# Patient Record
Sex: Female | Born: 1970 | Race: White | Hispanic: No | Smoking: Never smoker
Health system: Southern US, Community
[De-identification: ages and names within clinical notes are randomized; demographics above are authoritative.]

## PROBLEM LIST (undated history)

## (undated) DIAGNOSIS — K4090 Unilateral inguinal hernia, without obstruction or gangrene, not specified as recurrent: Secondary | ICD-10-CM

## (undated) DIAGNOSIS — N200 Calculus of kidney: Secondary | ICD-10-CM

## (undated) DIAGNOSIS — K5790 Diverticulosis of intestine, part unspecified, without perforation or abscess without bleeding: Secondary | ICD-10-CM

---

## 2014-09-01 ENCOUNTER — Encounter (HOSPITAL_BASED_OUTPATIENT_CLINIC_OR_DEPARTMENT_OTHER): Payer: Self-pay

## 2014-09-01 ENCOUNTER — Emergency Department (HOSPITAL_BASED_OUTPATIENT_CLINIC_OR_DEPARTMENT_OTHER)
Admission: EM | Admit: 2014-09-01 | Discharge: 2014-09-01 | Disposition: A | Discharge status: Home / Self Care | Payer: PRIVATE HEALTH INSURANCE | Attending: Emergency Medicine | Admitting: Emergency Medicine

## 2014-09-01 ENCOUNTER — Emergency Department (HOSPITAL_BASED_OUTPATIENT_CLINIC_OR_DEPARTMENT_OTHER): Payer: PRIVATE HEALTH INSURANCE

## 2014-09-01 DIAGNOSIS — B349 Viral infection, unspecified: Secondary | ICD-10-CM | POA: Diagnosis not present

## 2014-09-01 DIAGNOSIS — R509 Fever, unspecified: Secondary | ICD-10-CM | POA: Diagnosis present

## 2014-09-01 DIAGNOSIS — R000 Tachycardia, unspecified: Secondary | ICD-10-CM | POA: Insufficient documentation

## 2014-09-01 DIAGNOSIS — R6889 Other general symptoms and signs: Secondary | ICD-10-CM

## 2014-09-01 DIAGNOSIS — R05 Cough: Secondary | ICD-10-CM

## 2014-09-01 DIAGNOSIS — R059 Cough, unspecified: Secondary | ICD-10-CM

## 2014-09-01 DIAGNOSIS — Z880 Allergy status to penicillin: Secondary | ICD-10-CM | POA: Diagnosis not present

## 2014-09-01 LAB — RAPID STREP SCREEN (MED CTR MEBANE ONLY): Streptococcus, Group A Screen (Direct): NEGATIVE

## 2014-09-01 MED ORDER — IBUPROFEN 800 MG PO TABS
ORAL_TABLET | ORAL | Status: AC
  Filled 2014-09-01: qty 1

## 2014-09-01 MED ORDER — IBUPROFEN 800 MG PO TABS
800.0000 mg | ORAL_TABLET | Freq: Once | ORAL | Status: AC
  Administered 2014-09-01: 800 mg via ORAL

## 2014-09-01 MED ORDER — OSELTAMIVIR PHOSPHATE 75 MG PO CAPS: 75.0000 mg | ORAL_CAPSULE | Freq: Two times a day (BID) | ORAL | Status: AC

## 2014-09-01 NOTE — Discharge Instructions (Signed)
Rest and stay well hydrated. Take tamiflu as prescribed. Take tylenol and ibuprofen for your fever.  Influenza Influenza ("the flu") is a viral infection of the respiratory tract. It occurs more often in winter months because people spend more time in close contact with one another. Influenza can make you feel very sick. Influenza easily spreads from person to person (contagious). CAUSES  Influenza is caused by a virus that infects the respiratory tract. You can catch the virus by breathing in droplets from an infected person's cough or sneeze. You can also catch the virus by touching something that was recently contaminated with the virus and then touching your mouth, nose, or eyes. RISKS AND COMPLICATIONS You may be at risk for a more severe case of influenza if you smoke cigarettes, have diabetes, have chronic heart disease (such as heart failure) or lung disease (such as asthma), or if you have a weakened immune system. Elderly people and pregnant women are also at risk for more serious infections. The most common problem of influenza is a lung infection (pneumonia). Sometimes, this problem can require emergency medical care and may be life threatening. SIGNS AND SYMPTOMS  Symptoms typically last 4 to 10 days and may include:  Fever.  Chills.  Headache, body aches, and muscle aches.  Sore throat.  Chest discomfort and cough.  Poor appetite.  Weakness or feeling tired.  Dizziness.  Nausea or vomiting. DIAGNOSIS  Diagnosis of influenza is often made based on your history and a physical exam. A nose or throat swab test can be done to confirm the diagnosis. TREATMENT  In mild cases, influenza goes away on its own. Treatment is directed at relieving symptoms. For more severe cases, your health care provider may prescribe antiviral medicines to shorten the sickness. Antibiotic medicines are not effective because the infection is caused by a virus, not by bacteria. HOME CARE  INSTRUCTIONS  Take medicines only as directed by your health care provider.  Use a cool mist humidifier to make breathing easier.  Get plenty of rest until your temperature returns to normal. This usually takes 3 to 4 days.  Drink enough fluid to keep your urine clear or pale yellow.  Cover yourmouth and nosewhen coughing or sneezing,and wash your handswellto prevent thevirusfrom spreading.  Stay homefromwork orschool untilthe fever is gonefor at least 80fll day. PREVENTION  An annual influenza vaccination (flu shot) is the best way to avoid getting influenza. An annual flu shot is now routinely recommended for all adults in the [place]:  You experiencechest pain, yourcough worsens,or you producemore mucus.  Youhave nausea,vomiting, ordiarrhea.  Your fever returns or gets worse. SEEK IMMEDIATE MEDICAL CARE IF:  You havetrouble breathing, you become short of breath,or your skin ornails becomebluish.  You have severe painor stiffnessin the neck.  You develop a sudden headache, or pain in the face or ear.  You have nausea or vomiting that you cannot control. MAKE SURE YOU:   Understand these instructions.  Will watch your condition.  Will get help right away if you are not doing well or get worse. Document Released: 08/23/2000 Document Revised: 01/10/2014 Document Reviewed: 11/25/2011 [facility] Information 2015 [place] This information is not intended to replace advice given to you by your health care provider. Make sure you discuss any questions you have with your health care provider.  Viral Infections A viral infection can be caused by different types of viruses.Most viral infections are not serious and resolve on their own.  However, some infections may cause severe symptoms and may lead to further complications. SYMPTOMS Viruses can frequently cause:  Minor sore throat.  Aches and  pains.  Headaches.  Runny nose.  Different types of rashes.  Watery eyes.  Tiredness.  Cough.  Loss of appetite.  Gastrointestinal infections, resulting in nausea, vomiting, and diarrhea. These symptoms do not respond to antibiotics because the infection is not caused by bacteria. However, you might catch a bacterial infection following the viral infection. This is sometimes called a "superinfection." Symptoms of such a bacterial infection may include:  Worsening sore throat with pus and difficulty swallowing.  Swollen neck glands.  Chills and a high or persistent fever.  Severe headache.  Tenderness over the sinuses.  Persistent overall ill feeling (malaise), muscle aches, and tiredness (fatigue).  Persistent cough.  Yellow, green, or brown mucus production with coughing. HOME CARE INSTRUCTIONS   Only take over-the-counter or prescription medicines for pain, discomfort, diarrhea, or fever as directed by your caregiver.  Drink enough water and fluids to keep your urine clear or pale yellow. Sports drinks can provide valuable electrolytes, sugars, and hydration.  Get plenty of rest and maintain proper nutrition. Soups and broths with crackers or rice are fine. SEEK IMMEDIATE MEDICAL CARE IF:   You have severe headaches, shortness of breath, chest pain, neck pain, or an unusual rash.  You have uncontrolled vomiting, diarrhea, or you are unable to keep down fluids.  You or your child has an oral temperature above 102 F (38.9 C), not controlled by medicine.  Your baby is older than 3 months with a rectal temperature of 102 F (38.9 C) or higher.  Your baby is 15 months old or younger with a rectal temperature of 100.4 F (38 C) or higher. MAKE SURE YOU:   Understand these instructions.  Will watch your condition.  Will get help right away if you are not doing well or get worse. Document Released: 06/05/2005 Document Revised: 11/18/2011 Document Reviewed:  12/31/2010 [facility] Patient Information 2015 [place], [place]. This information is not intended to replace advice given to you by your health care provider. Make sure you discuss any questions you have with your health care provider.

## 2014-09-01 NOTE — ED Provider Notes (Signed)
CSN: [id number]     Arrival date & time 09/01/14  1624 History   First MD Initiated Contact with Patient 09/01/14 1756     Chief Complaint  Patient presents with  . Fever     (Consider location/radiation/quality/duration/timing/severity/associated sxs/prior Treatment) HPI Comments: 43 year old female presenting with fever beginning around 12:00 PM today. Patient reports yesterday she was not feeling very well, and today developed a productive cough with clear mucus, chest congestion, generalized body aches and nausea. She did not check her temperature at home. She took Tylenol around 3:00 PM today. No vomiting or diarrhea. Denies any urinary symptoms.  The history is provided by the patient.    History reviewed. No pertinent past medical history. History reviewed. No pertinent past surgical history. No family history on file. History  Substance Use Topics  . Smoking status: Never Smoker   . Smokeless tobacco: Not on file  . Alcohol Use: No   OB History    No data available     Review of Systems  10 Systems reviewed and are negative for acute change except as noted in the HPI.  Allergies  Penicillins; Sulfa antibiotics; and Ceftin  Home Medications   Prior to Admission medications   Medication Sig Start Date End Date Taking? Authorizing Provider  Sertraline HCl (ZOLOFT PO) Take by mouth.   Yes Historical Provider, MD  oseltamivir (TAMIFLU) 75 MG capsule Take 1 capsule (75 mg total) by mouth every 12 (twelve) hours. 09/01/14   Daleigh Pollinger M Adilenne Ashworth, PA-C   BP 151/83 mmHg  Pulse 102  Temp(Src) 100.8 F (38.2 C) (Oral)  Resp 16  Ht 5' 6"  (1.676 m)  Wt 195 lb (88.451 kg)  BMI 31.49 kg/m2  SpO2 97%  LMP 07/24/2014 Physical Exam  Constitutional: She is oriented to person, place, and time. She appears well-developed and well-nourished. No distress.  HENT:  Head: Normocephalic and atraumatic.  Post-oropharyngeal erythema without edema or exudate.  Eyes: Conjunctivae are  normal.  Neck: Normal range of motion. Neck supple.  No meningeal signs.  Cardiovascular: Regular rhythm and normal heart sounds.   Tachycardic.  Pulmonary/Chest: Effort normal and breath sounds normal. No respiratory distress.  Abdominal: Soft. Bowel sounds are normal. There is no tenderness.  Musculoskeletal: Normal range of motion. She exhibits no edema.  Neurological: She is alert and oriented to person, place, and time.  Skin: Skin is warm and dry. No rash noted. She is not diaphoretic.  Psychiatric: She has a normal mood and affect. Her behavior is normal.  Nursing note and vitals reviewed.   ED Course  Procedures (including critical care time) Labs Review Labs Reviewed  RAPID STREP SCREEN  CULTURE, GROUP A STREP    Imaging Review Dg Chest 2 View  09/01/2014   CLINICAL DATA:  Cough starting yesterday with chest tightness. Fever today. History of asthma.  EXAM: CHEST  2 VIEW  COMPARISON:  None.  FINDINGS: The heart size and mediastinal contours are within normal limits. Both lungs are clear. The visualized skeletal structures are unremarkable.  IMPRESSION: No active cardiopulmonary disease.   Electronically Signed   By: Marin Olp M.D.   On: 09/01/2014 17:00     EKG Interpretation None      MDM   Final diagnoses:  Viral illness  Flu-like symptoms   Patient in no apparent distress. Tachycardic on arrival with a fever. Chest x-ray obtained in triage prior to patient being seen, no acute finding. Rapid strep negative. Her symptoms began today and her  flulike. Will start Tamiflu. After receiving ibuprofen, her temperature started to decrease in her pulse rate started to improve. She states she is feeling a little better. Discussed symptomatic treatment. Stable for discharge. Return precautions given. Patient states understanding of treatment care plan and is agreeable.   Carman Ching, PA-C 09/01/14 1918  Threasa Beards, MD 09/01/14 [place]

## 2014-09-01 NOTE — ED Notes (Signed)
C/o prod cough, chest congestion, HA started yesterday-fever today

## 2014-09-03 LAB — CULTURE, GROUP A STREP

## 2015-10-13 IMAGING — CR DG CHEST 2V
2 series · 2 of 2 positions shown · non-contrast
Comparison: None.

CLINICAL DATA: Cough starting yesterday with chest tightness. Fever
today. History of asthma.

EXAM:
CHEST  2 VIEW

[w chest pa]
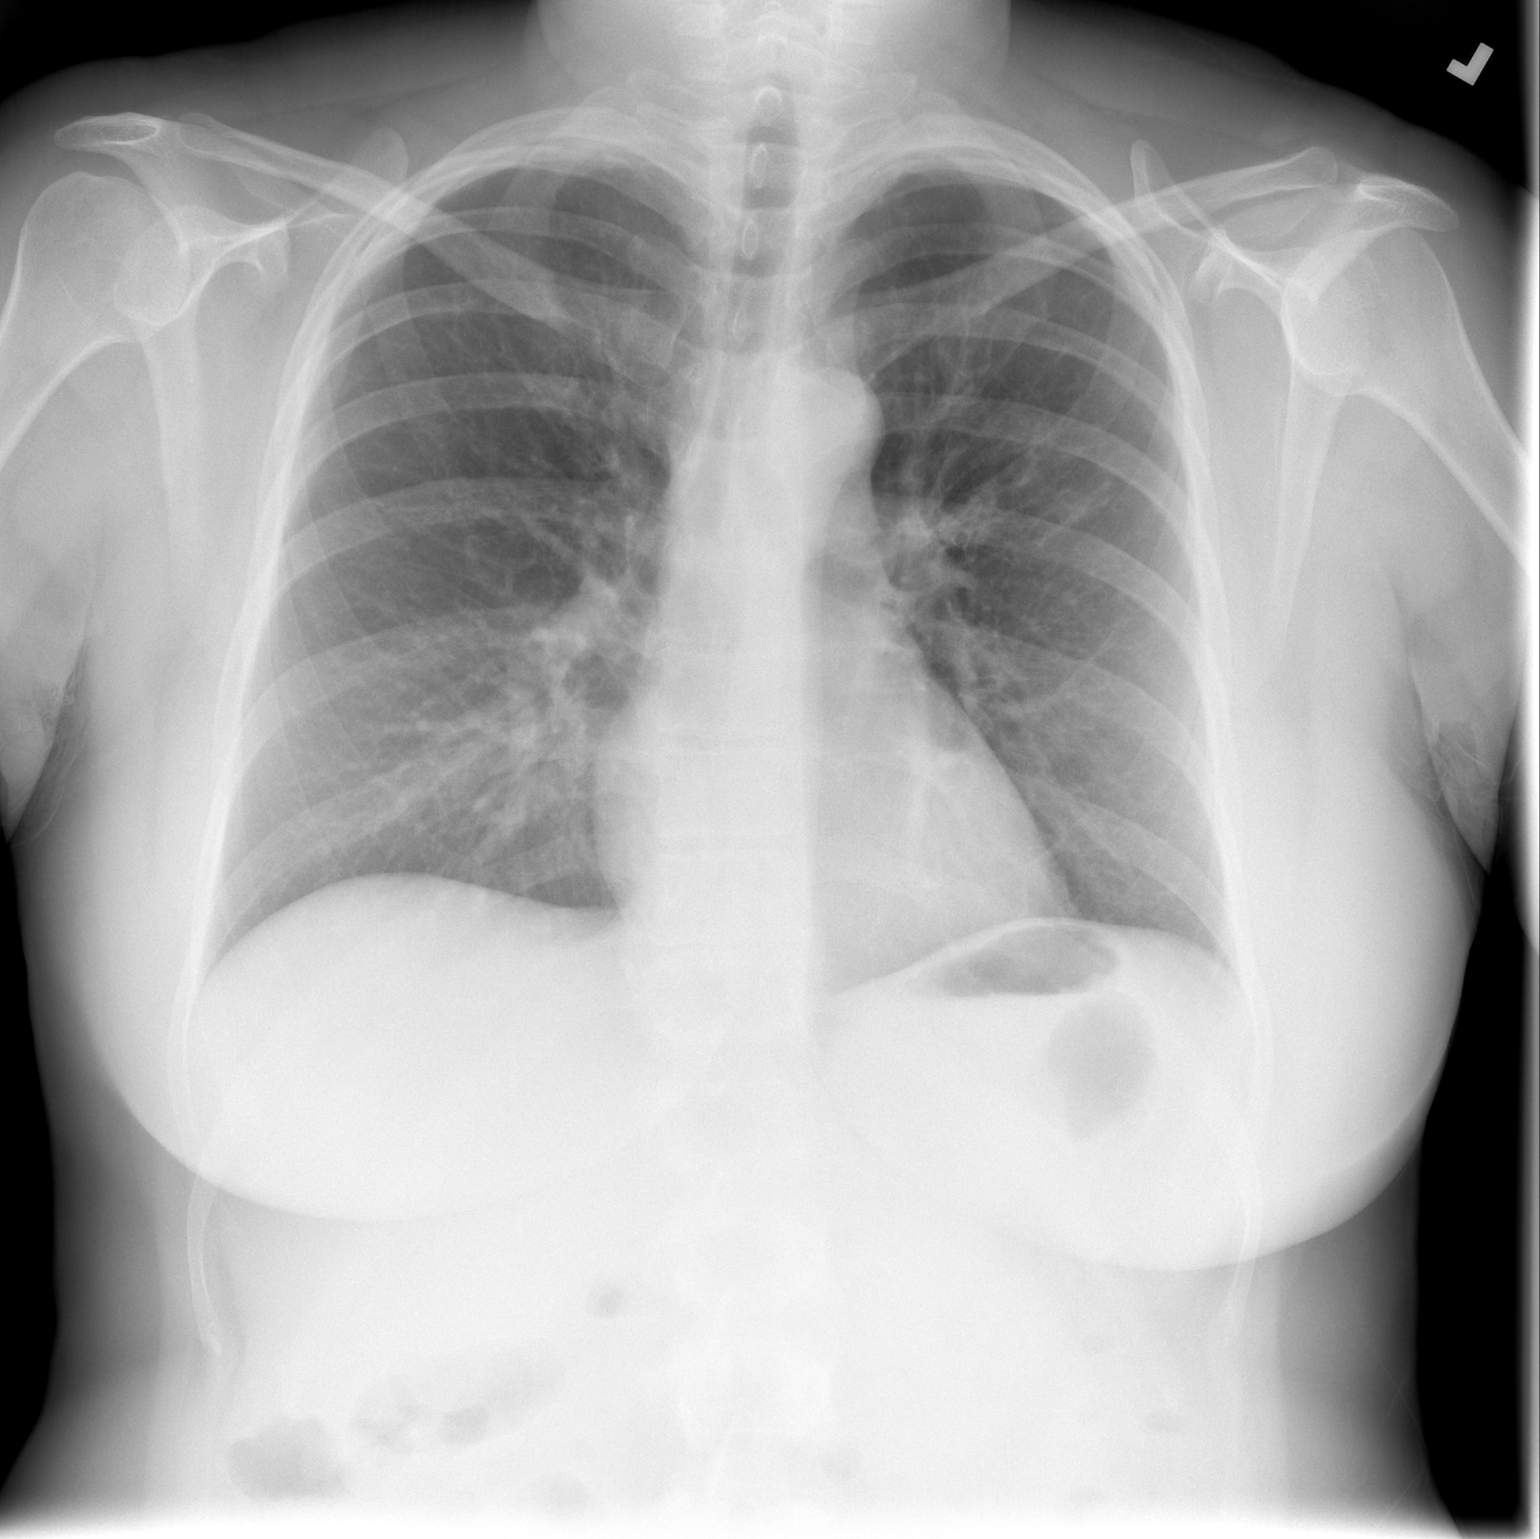

[w chest lat]
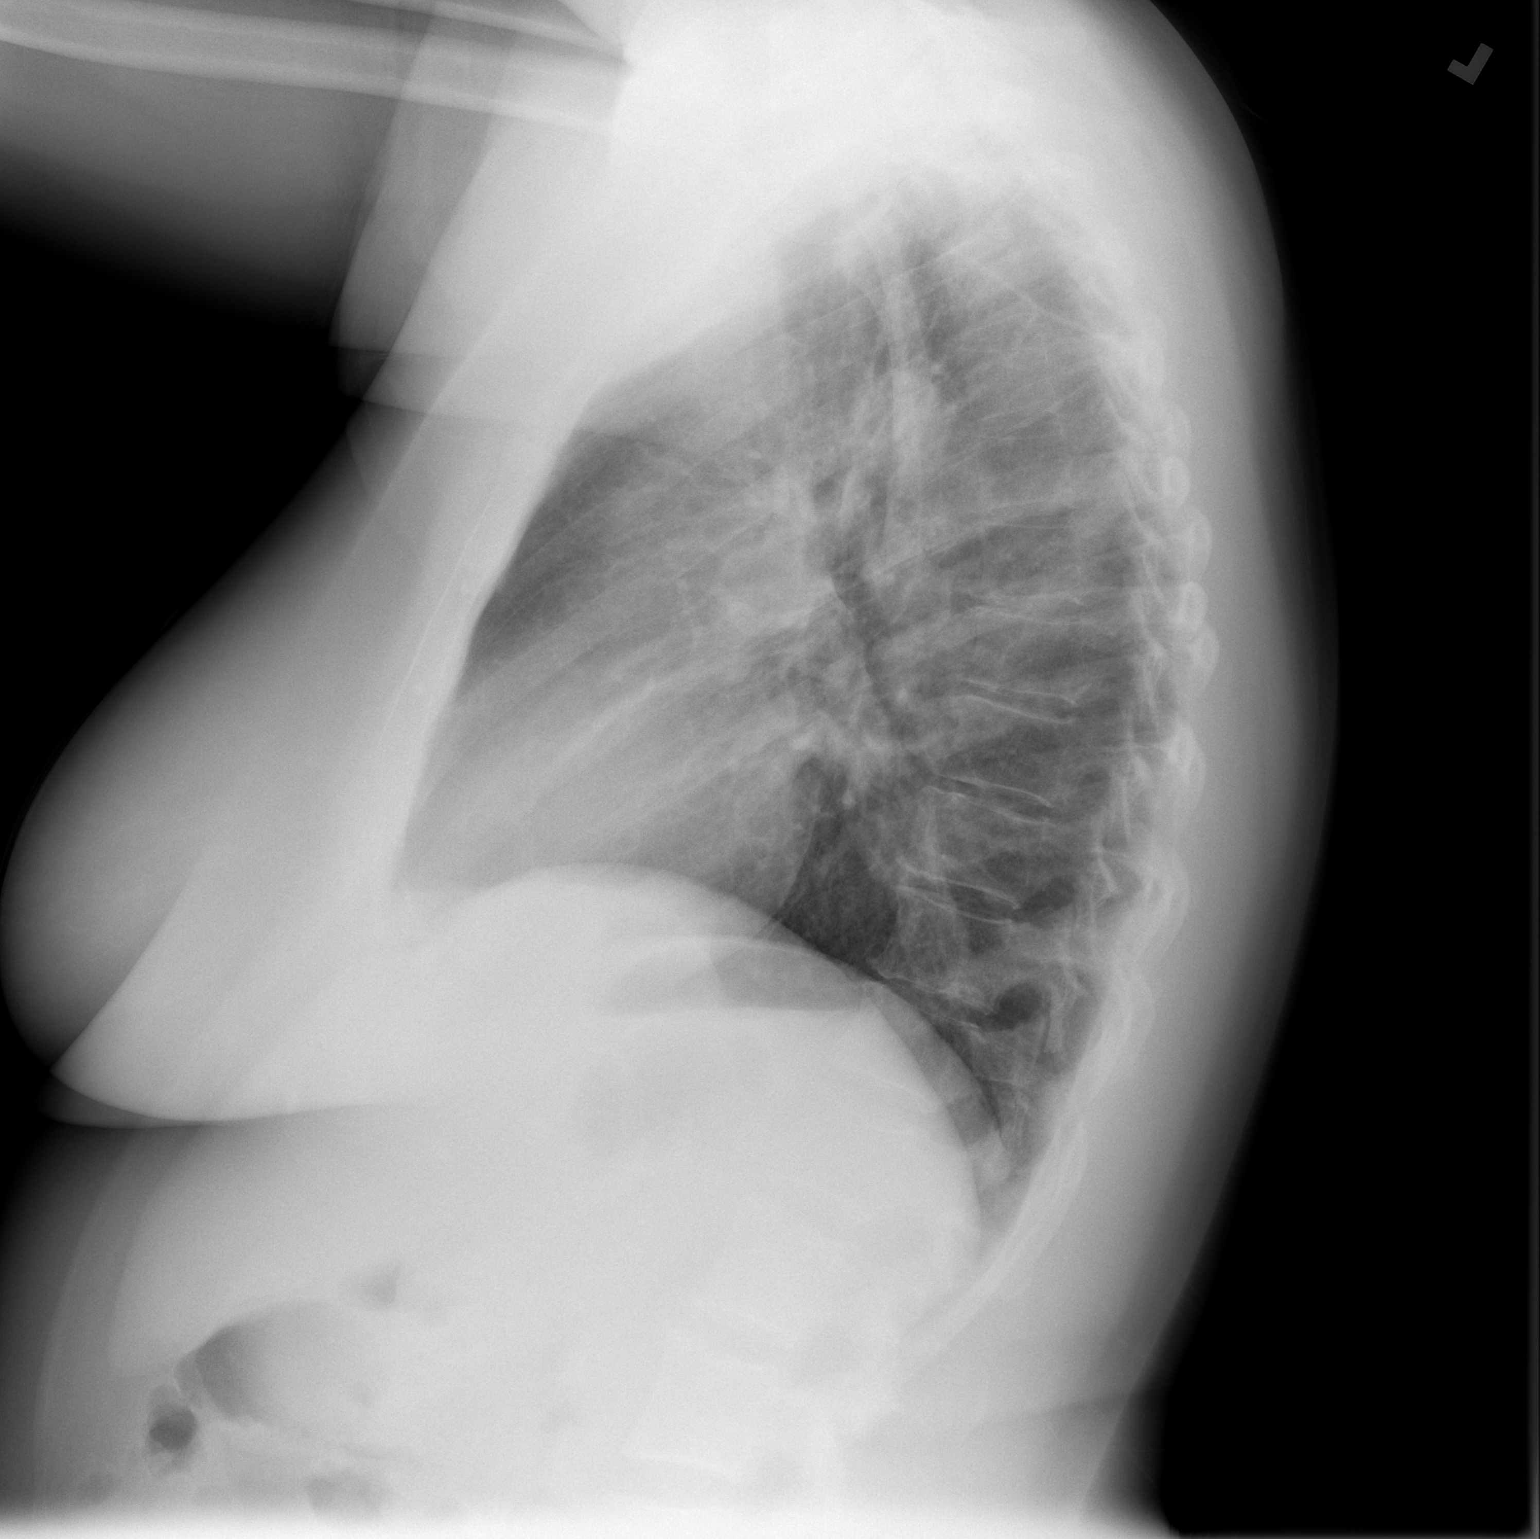

[2 of 2 positions shown; findings below may reference images not displayed]

FINDINGS: The heart size and mediastinal contours are within normal limits.
Both lungs are clear. The visualized skeletal structures are
unremarkable.
IMPRESSION: No active cardiopulmonary disease.

## 2023-03-07 ENCOUNTER — Other Ambulatory Visit: Payer: Self-pay

## 2023-03-07 DIAGNOSIS — N132 Hydronephrosis with renal and ureteral calculous obstruction: Secondary | ICD-10-CM | POA: Insufficient documentation

## 2023-03-07 DIAGNOSIS — K5732 Diverticulitis of large intestine without perforation or abscess without bleeding: Secondary | ICD-10-CM | POA: Diagnosis not present

## 2023-03-07 DIAGNOSIS — R1031 Right lower quadrant pain: Secondary | ICD-10-CM | POA: Diagnosis present

## 2023-03-07 NOTE — ED Triage Notes (Signed)
Pt reports chronic abdominal pain/kidney stones for past year. Pain became significantly worse last night and patient developed fever of 101F. Pt reports feeling like she isnt fully emptying when urinating. PT states she took an oxycodone at night which did not improve the pain. Admits to nausea, denies vomiting. Pain worst in lower abdomen. Movement exacerbates pain.

## 2023-03-08 ENCOUNTER — Emergency Department (HOSPITAL_BASED_OUTPATIENT_CLINIC_OR_DEPARTMENT_OTHER): Payer: 59

## 2023-03-08 ENCOUNTER — Emergency Department (HOSPITAL_BASED_OUTPATIENT_CLINIC_OR_DEPARTMENT_OTHER)
Admission: EM | Admit: 2023-03-08 | Discharge: 2023-03-08 | Disposition: A | Discharge status: Home / Self Care | Payer: 59 | Attending: Emergency Medicine | Admitting: Emergency Medicine

## 2023-03-08 ENCOUNTER — Encounter (HOSPITAL_BASED_OUTPATIENT_CLINIC_OR_DEPARTMENT_OTHER): Payer: Self-pay

## 2023-03-08 DIAGNOSIS — N200 Calculus of kidney: Secondary | ICD-10-CM

## 2023-03-08 DIAGNOSIS — K5732 Diverticulitis of large intestine without perforation or abscess without bleeding: Secondary | ICD-10-CM

## 2023-03-08 HISTORY — DX: Unilateral inguinal hernia, without obstruction or gangrene, not specified as recurrent: K40.90

## 2023-03-08 HISTORY — DX: Diverticulosis of intestine, part unspecified, without perforation or abscess without bleeding: K57.90

## 2023-03-08 HISTORY — DX: Calculus of kidney: N20.0

## 2023-03-08 LAB — URINALYSIS, ROUTINE W REFLEX MICROSCOPIC
Bilirubin Urine: NEGATIVE
Glucose, UA: NEGATIVE mg/dL
Ketones, ur: NEGATIVE mg/dL
Nitrite: NEGATIVE
Protein, ur: 30 mg/dL — AB
Specific Gravity, Urine: 1.02 (ref 1.005–1.030)
pH: 6 (ref 5.0–8.0)

## 2023-03-08 LAB — CBC
HCT: 36.6 % (ref 36.0–46.0)
Hemoglobin: 12.2 g/dL (ref 12.0–15.0)
MCH: 30.4 pg (ref 26.0–34.0)
MCHC: 33.3 g/dL (ref 30.0–36.0)
MCV: 91.3 fL (ref 80.0–100.0)
Platelets: 260 10*3/uL (ref 150–400)
RBC: 4.01 MIL/uL (ref 3.87–5.11)
RDW: 13 % (ref 11.5–15.5)
WBC: 17.9 10*3/uL — ABNORMAL HIGH (ref 4.0–10.5)
nRBC: 0 % (ref 0.0–0.2)

## 2023-03-08 LAB — COMPREHENSIVE METABOLIC PANEL
ALT: 20 U/L (ref 0–44)
AST: 23 U/L (ref 15–41)
Albumin: 4.2 g/dL (ref 3.5–5.0)
Alkaline Phosphatase: 108 U/L (ref 38–126)
Anion gap: 9 (ref 5–15)
BUN: 12 mg/dL (ref 6–20)
CO2: 23 mmol/L (ref 22–32)
Calcium: 9 mg/dL (ref 8.9–10.3)
Chloride: 104 mmol/L (ref 98–111)
Creatinine, Ser: 0.93 mg/dL (ref 0.44–1.00)
GFR, Estimated: >60 mL/min (ref >60)
Glucose, Bld: 142 mg/dL — ABNORMAL HIGH (ref 70–99)
Potassium: 3.6 mmol/L (ref 3.5–5.1)
Sodium: 136 mmol/L (ref 135–145)
Total Bilirubin: 0.7 mg/dL (ref 0.3–1.2)
Total Protein: 7.7 g/dL (ref 6.5–8.1)

## 2023-03-08 LAB — URINALYSIS, MICROSCOPIC (REFLEX)

## 2023-03-08 LAB — HCG, QUANTITATIVE, PREGNANCY: hCG, Beta Chain, Quant, S: 2 m[IU]/mL (ref <5)

## 2023-03-08 LAB — LIPASE, BLOOD: Lipase: 43 U/L (ref 11–51)

## 2023-03-08 LAB — TROPONIN I (HIGH SENSITIVITY): Troponin I (High Sensitivity): 2 ng/L (ref <18)

## 2023-03-08 MED ORDER — METRONIDAZOLE 500 MG PO TABS: 500.0000 mg | ORAL_TABLET | Freq: Three times a day (TID) | ORAL | 0 refills | Status: AC

## 2023-03-08 MED ORDER — NITROFURANTOIN MONOHYD MACRO 100 MG PO CAPS
100.0000 mg | ORAL_CAPSULE | Freq: Once | ORAL | Status: AC
  Administered 2023-03-08: 100 mg via ORAL
  Filled 2023-03-08: qty 1

## 2023-03-08 MED ORDER — METRONIDAZOLE 500 MG PO TABS: 500.0000 mg | ORAL_TABLET | Freq: Three times a day (TID) | ORAL | 0 refills | Status: DC

## 2023-03-08 MED ORDER — ONDANSETRON 4 MG PO TBDP: 4.0000 mg | ORAL_TABLET | Freq: Once | ORAL | Status: DC | PRN

## 2023-03-08 MED ORDER — NITROFURANTOIN MONOHYD MACRO 100 MG PO CAPS: 100.0000 mg | ORAL_CAPSULE | Freq: Two times a day (BID) | ORAL | 0 refills | Status: DC

## 2023-03-08 MED ORDER — ONDANSETRON 8 MG PO TBDP: ORAL_TABLET | ORAL | 0 refills | Status: DC

## 2023-03-08 MED ORDER — TAMSULOSIN HCL 0.4 MG PO CAPS
0.4000 mg | ORAL_CAPSULE | ORAL | Status: AC
  Administered 2023-03-08: 0.4 mg via ORAL
  Filled 2023-03-08: qty 1

## 2023-03-08 MED ORDER — TAMSULOSIN HCL 0.4 MG PO CAPS: 0.4000 mg | ORAL_CAPSULE | Freq: Every day | ORAL | 0 refills | Status: AC

## 2023-03-08 MED ORDER — DICLOFENAC SODIUM ER 100 MG PO TB24: 100.0000 mg | ORAL_TABLET | Freq: Every day | ORAL | 0 refills | Status: AC

## 2023-03-08 MED ORDER — HYDROCODONE-ACETAMINOPHEN 5-325 MG PO TABS: 1.0000 | ORAL_TABLET | Freq: Four times a day (QID) | ORAL | 0 refills | Status: AC | PRN

## 2023-03-08 MED ORDER — MAGNESIUM SULFATE 2 GM/50ML IV SOLN
2.0000 g | Freq: Once | INTRAVENOUS | Status: AC
  Administered 2023-03-08: 2 g via INTRAVENOUS
  Filled 2023-03-08: qty 50

## 2023-03-08 MED ORDER — DROPERIDOL 2.5 MG/ML IJ SOLN
1.2500 mg | Freq: Once | INTRAMUSCULAR | Status: AC
  Administered 2023-03-08: 1.25 mg via INTRAVENOUS
  Filled 2023-03-08: qty 2

## 2023-03-08 MED ORDER — CIPROFLOXACIN HCL 500 MG PO TABS: 500.0000 mg | ORAL_TABLET | Freq: Two times a day (BID) | ORAL | 0 refills | Status: AC

## 2023-03-08 MED ORDER — SODIUM CHLORIDE 0.9 % IV BOLUS
500.0000 mL | Freq: Once | INTRAVENOUS | Status: AC
  Administered 2023-03-08: 500 mL via INTRAVENOUS

## 2023-03-08 MED ORDER — KETOROLAC TROMETHAMINE 30 MG/ML IJ SOLN
30.0000 mg | Freq: Once | INTRAMUSCULAR | Status: AC
  Administered 2023-03-08: 30 mg via INTRAVENOUS
  Filled 2023-03-08: qty 1

## 2023-03-08 NOTE — ED Triage Notes (Signed)
Pt had syncopal episode in triage and vomited while unconscious.

## 2023-03-08 NOTE — ED Provider Notes (Signed)
[facility] Provider Note   CSN: [id number] Arrival date & time: 03/07/23  2351     History  Chief Complaint  Patient presents with   Abdominal Pain   Fever   Loss of Consciousness    Jasmin Rivas is a 52 y.o. female.  The history is provided by the patient.  Abdominal Pain Pain location: B groin. Pain radiates to:  Does not radiate Onset quality:  Gradual Duration:  52 weeks Timing:  Constant Progression:  Worsening Context: not sick contacts   Relieved by:  Nothing Worsened by:  Nothing Ineffective treatments: home narcotics. Associated symptoms: fever, nausea and vomiting   Associated symptoms: no chest pain, no cough, no diarrhea and no sore throat   Risk factors: no recent hospitalization   Patient with a h/o kidney stones presents with 1 year of of lower abdominal pain. It was thought to have been perhaps stones or hernias in the past but no definite cause has been found.  Earlier in the day patient reports fever to 101. Pain worsened this evening.  Patient then had emesis in the ED and was syncopal in the ED after emesis.    Past Medical History:  Diagnosis Date   Diverticulosis    Inguinal hernia    Kidney stones     Past Medical History:  Diagnosis Date   Diverticulosis    Inguinal hernia    Kidney stones      Home Medications Prior to Admission medications   Medication Sig Start Date End Date Taking? Authorizing Provider  oseltamivir (TAMIFLU) 75 MG capsule Take 1 capsule (75 mg total) by mouth every 12 (twelve) hours. 09/01/14   Hess, Nada Boozer, PA-C  Sertraline HCl (ZOLOFT PO) Take by mouth.    [provider]      Allergies    Penicillins, Sulfa antibiotics, and Ceftin [cefuroxime axetil]    Review of Systems   Review of Systems  Constitutional:  Positive for fever.  HENT:  Negative for congestion, facial swelling and sore throat.   Respiratory:  Negative for cough.   Cardiovascular:   Negative for chest pain.  Gastrointestinal:  Positive for abdominal pain, nausea and vomiting. Negative for diarrhea.  Musculoskeletal:  Negative for neck pain.  Skin:  Negative for rash.  Neurological:  Positive for syncope.    Physical Exam Updated Vital Signs BP 109/85   Pulse (!) 101   Temp 99.3 F (37.4 C) (Oral)   Resp 16   Ht 5\' 3"  (1.6 m)   Wt 90.7 kg   SpO2 90%   BMI 35.43 kg/m  Physical Exam Vitals and nursing note reviewed.  Constitutional:      General: She is not in acute distress.    Appearance: Normal appearance. She is well-developed. She is not ill-appearing or diaphoretic.  HENT:     Head: Normocephalic and atraumatic.     Nose: Nose normal.  Eyes:     Pupils: Pupils are equal, round, and reactive to light.  Cardiovascular:     Rate and Rhythm: Normal rate and regular rhythm.     Pulses: Normal pulses.     Heart sounds: Normal heart sounds.  Pulmonary:     Effort: Pulmonary effort is normal. No respiratory distress.     Breath sounds: Normal breath sounds.  Abdominal:     General: Bowel sounds are normal. There is no distension.     Palpations: Abdomen is soft. There is no mass.  Tenderness: There is no abdominal tenderness. There is no guarding or rebound.     Hernia: No hernia is present.  Genitourinary:    Vagina: No vaginal discharge.  Musculoskeletal:        General: Normal range of motion.     Cervical back: Normal range of motion and neck supple.  Skin:    General: Skin is warm and dry.     Capillary Refill: Capillary refill takes less than 2 seconds.     Findings: No erythema or rash.  Neurological:     General: No focal deficit present.     Mental Status: She is alert and oriented to person, place, and time.     Deep Tendon Reflexes: Reflexes normal.  Psychiatric:        Mood and Affect: Mood normal.        Behavior: Behavior normal.     ED Results / Procedures / Treatments   Labs (all labs ordered are listed, but only  abnormal results are displayed) Results for orders placed or performed during the hospital encounter of 03/08/23  Comprehensive metabolic panel  Result Value Ref Range   Sodium 136 135 - 145 mmol/L   Potassium 3.6 3.5 - 5.1 mmol/L   Chloride 104 98 - 111 mmol/L   CO2 23 22 - 32 mmol/L   Glucose, Bld 142 (H) 70 - 99 mg/dL   BUN 12 6 - 20 mg/dL   Creatinine, Ser 1.61 0.44 - 1.00 mg/dL   Calcium 9.0 8.9 - 09.6 mg/dL   Total Protein 7.7 6.5 - 8.1 g/dL   Albumin 4.2 3.5 - 5.0 g/dL   AST 23 15 - 41 U/L   ALT 20 0 - 44 U/L   Alkaline Phosphatase 108 38 - 126 U/L   Total Bilirubin 0.7 0.3 - 1.2 mg/dL   GFR, Estimated >04 >54 mL/min   Anion gap 9 5 - 15  CBC  Result Value Ref Range   WBC 17.9 (H) 4.0 - 10.5 K/uL   RBC 4.01 3.87 - 5.11 MIL/uL   Hemoglobin 12.2 12.0 - 15.0 g/dL   HCT 09.8 11.9 - 14.7 %   MCV 91.3 80.0 - 100.0 fL   MCH 30.4 26.0 - 34.0 pg   MCHC 33.3 30.0 - 36.0 g/dL   RDW 82.9 56.2 - 13.0 %   Platelets 260 150 - 400 K/uL   nRBC 0.0 0.0 - 0.2 %  Urinalysis, Routine w reflex microscopic -Urine, Clean Catch  Result Value Ref Range   Color, Urine YELLOW YELLOW   APPearance HAZY (A) CLEAR   Specific Gravity, Urine 1.020 1.005 - 1.030   pH 6.0 5.0 - 8.0   Glucose, UA NEGATIVE NEGATIVE mg/dL   Hgb urine dipstick MODERATE (A) NEGATIVE   Bilirubin Urine NEGATIVE NEGATIVE   Ketones, ur NEGATIVE NEGATIVE mg/dL   Protein, ur 30 (A) NEGATIVE mg/dL   Nitrite NEGATIVE NEGATIVE   Leukocytes,Ua TRACE (A) NEGATIVE  Lipase, blood  Result Value Ref Range   Lipase 43 11 - 51 U/L  hCG, quantitative, pregnancy  Result Value Ref Range   hCG, Beta Chain, Quant, S 2 <5 mIU/mL  Urinalysis, Microscopic (reflex)  Result Value Ref Range   RBC / HPF 6-10 0 - 5 RBC/hpf   WBC, UA 11-20 0 - 5 WBC/hpf   Bacteria, UA RARE (A) NONE SEEN   Squamous Epithelial / HPF 0-5 0 - 5 /HPF   Mucus PRESENT    Hyaline Casts, UA PRESENT  Urine-Other LESS THAN 10 mL OF URINE SUBMITTED   Troponin I  (High Sensitivity)  Result Value Ref Range   Troponin I (High Sensitivity) 2 <18 ng/L   CT Renal Stone Study  Result Date: 03/08/2023 CLINICAL DATA:  Flank pain EXAM: CT ABDOMEN AND PELVIS WITHOUT CONTRAST TECHNIQUE: Multidetector CT imaging of the abdomen and pelvis was performed following the standard protocol without IV contrast. RADIATION DOSE REDUCTION: This exam was performed according to the departmental dose-optimization program which includes automated exposure control, adjustment of the mA and/or kV according to patient size and/or use of iterative reconstruction technique. COMPARISON:  None Available. FINDINGS: Lower chest: No acute abnormality. Hepatobiliary: Gallbladder is well distended with a single dependent gallstone. Pancreas: Unremarkable. No pancreatic ductal dilatation or surrounding inflammatory changes. Spleen: Normal in size without focal abnormality. Adrenals/Urinary Tract: Adrenal glands are within normal limits. Right kidney demonstrates no renal calculi or urinary tract obstructive changes. Left kidney demonstrates hydronephrosis and hydroureter secondary to a proximal left ureteral stone which measures up to 5 mm. The more distal left ureter is within normal limits. Stomach/Bowel: Colon shows evidence of diverticulitis in the sigmoid without evidence of perforation or abscess formation. The more proximal colon is unremarkable. The appendix is not well visualized. No inflammatory changes to suggest appendicitis are seen. Stomach and small bowel are unremarkable. Vascular/Lymphatic: Aortic atherosclerosis. No enlarged abdominal or pelvic lymph nodes. Reproductive: Uterus and bilateral adnexa are unremarkable. IUD is noted in place. Other: No abdominal wall hernia or abnormality. No abdominopelvic ascites. Musculoskeletal: No acute or significant osseous findings. IMPRESSION: 5 mm proximal to mid left ureteral stone with obstructive change. Changes of diverticulitis without evidence  of abscess or perforation. Cholelithiasis without complicating factors. Electronically Signed   By: Alcide Clever M.D.   On: 03/08/2023 01:41    EKG  EKG Interpretation Date/Time:  Saturday March 08 2023 00:18:02 EDT Ventricular Rate:  106 PR Interval:  155 QRS Duration:  111 QT Interval:  344 QTC Calculation: 457 R Axis:   31  Text Interpretation: Sinus tachycardia Confirmed by Connee Ikner ([id number]) on 03/08/2023 2:51:34 AM         Radiology CT Renal Stone Study  Result Date: 03/08/2023 CLINICAL DATA:  Flank pain EXAM: CT ABDOMEN AND PELVIS WITHOUT CONTRAST TECHNIQUE: Multidetector CT imaging of the abdomen and pelvis was performed following the standard protocol without IV contrast. RADIATION DOSE REDUCTION: This exam was performed according to the departmental dose-optimization program which includes automated exposure control, adjustment of the mA and/or kV according to patient size and/or use of iterative reconstruction technique. COMPARISON:  None Available. FINDINGS: Lower chest: No acute abnormality. Hepatobiliary: Gallbladder is well distended with a single dependent gallstone. Pancreas: Unremarkable. No pancreatic ductal dilatation or surrounding inflammatory changes. Spleen: Normal in size without focal abnormality. Adrenals/Urinary Tract: Adrenal glands are within normal limits. Right kidney demonstrates no renal calculi or urinary tract obstructive changes. Left kidney demonstrates hydronephrosis and hydroureter secondary to a proximal left ureteral stone which measures up to 5 mm. The more distal left ureter is within normal limits. Stomach/Bowel: Colon shows evidence of diverticulitis in the sigmoid without evidence of perforation or abscess formation. The more proximal colon is unremarkable. The appendix is not well visualized. No inflammatory changes to suggest appendicitis are seen. Stomach and small bowel are unremarkable. Vascular/Lymphatic: Aortic atherosclerosis. No enlarged  abdominal or pelvic lymph nodes. Reproductive: Uterus and bilateral adnexa are unremarkable. IUD is noted in place. Other: No abdominal wall hernia or abnormality. No abdominopelvic  ascites. Musculoskeletal: No acute or significant osseous findings. IMPRESSION: 5 mm proximal to mid left ureteral stone with obstructive change. Changes of diverticulitis without evidence of abscess or perforation. Cholelithiasis without complicating factors. Electronically Signed   By: Alcide Clever M.D.   On: 03/08/2023 01:41    Procedures Procedures    Medications Ordered in ED Medications  ondansetron (ZOFRAN-ODT) disintegrating tablet 4 mg (has no administration in time range)  magnesium sulfate IVPB 2 g 50 mL (2 g Intravenous New Bag/Given 03/08/23 0212)  droperidol (INAPSINE) 2.5 MG/ML injection 1.25 mg (1.25 mg Intravenous Given 03/08/23 0039)  sodium chloride 0.9 % bolus 500 mL (0 mLs Intravenous Stopped 03/08/23 0114)  ketorolac (TORADOL) 30 MG/ML injection 30 mg (30 mg Intravenous Given 03/08/23 0212)  nitrofurantoin (macrocrystal-monohydrate) (MACROBID) capsule 100 mg (100 mg Oral Given 03/08/23 0212)  tamsulosin (FLOMAX) capsule 0.4 mg (0.4 mg Oral Given 03/08/23 0213)    ED Course/ Medical Decision Making/ A&P                             Medical Decision Making Patient with a h/o abdominal pain x 1 year with no definite cause presents with pain and emesis and then a syncopal event   Amount and/or Complexity of Data Reviewed Independent Historian:     Details: Family member see above  External Data Reviewed: notes.    Details: Previous notes reviewed  Labs: ordered.    Details: Troponin negative at 2.  Negative pregnancy test.  Urine with blood, no convincing UTI  Elevated white count 17.9, normal hemoglobin 12.2. normal platelets.  Normal lipase 43.    Radiology: ordered and independent interpretation performed.    Details: Proximal left kidney stone.  No cysts by me  ECG/medicine tests: ordered  and independent interpretation performed. Decision-making details documented in ED Course.  Risk Prescription drug management. Risk Details: Will treat for both kidney stone and Diverticulitis. Given allergies patient will need to be on cipro flagyl to treat for this.  I have advised no exercising due to the risk of tendon injuries.  I have also advised no alcohol with pain medication and while taking flagyl.  I have advised straining all urine and following up with urology within one week and GI as an outpatient.  Neither of these 2 illnesses have been going on for a year and I cannot answer where the the pain for a year is coming from, but patient should follow up with PMD and GI for ongoing testing and treatment.  Stable for discharge with close follow up.  Strict return    Final Clinical Impression(s) / ED Diagnoses Final diagnoses:  None   Return for intractable cough, coughing up blood, fevers > 100.4 unrelieved by medication, shortness of breath, intractable vomiting, chest pain, shortness of breath, weakness, numbness, changes in speech, facial asymmetry, abdominal pain, passing out, Inability to tolerate liquids or food, cough, altered mental status or any concerns. No signs of systemic illness or infection. The patient is nontoxic-appearing on exam and vital signs are within normal limits.  I have reviewed the triage vital signs and the nursing notes. Pertinent labs & imaging results that were available during my care of the patient were reviewed by me and considered in my medical decision making (see chart for details). After history, exam, and medical workup I feel the patient has been appropriately medically screened and is safe for discharge home. Pertinent diagnoses were discussed with the  patient. Patient was given return precautions.    Rx / DC Orders ED Discharge Orders     None         Zonya Gudger, MD 03/08/23 [id number]

## 2023-03-08 NOTE — ED Notes (Signed)
Pt states she has taken flomax without any negative reactions previously. EDP aware.
